# Patient Record
Sex: Female | Born: 2000
Health system: Southern US, Community
[De-identification: ages and names within clinical notes are randomized; demographics above are authoritative.]

## PROBLEM LIST (undated history)

## (undated) DIAGNOSIS — L709 Acne, unspecified: Secondary | ICD-10-CM

## (undated) DIAGNOSIS — Z789 Other specified health status: Secondary | ICD-10-CM

---

## 2018-02-06 ENCOUNTER — Emergency Department
Admission: EM | Admit: 2018-02-06 | Discharge: 2018-02-06 | Disposition: A | Discharge status: Home / Self Care | Payer: No Typology Code available for payment source | Attending: Emergency Medicine | Admitting: Emergency Medicine

## 2018-02-06 ENCOUNTER — Emergency Department: Payer: No Typology Code available for payment source

## 2018-02-06 ENCOUNTER — Encounter: Payer: Self-pay | Admitting: Emergency Medicine

## 2018-02-06 DIAGNOSIS — Y939 Activity, unspecified: Secondary | ICD-10-CM | POA: Insufficient documentation

## 2018-02-06 DIAGNOSIS — S93601A Unspecified sprain of right foot, initial encounter: Secondary | ICD-10-CM | POA: Diagnosis not present

## 2018-02-06 DIAGNOSIS — Y999 Unspecified external cause status: Secondary | ICD-10-CM | POA: Insufficient documentation

## 2018-02-06 DIAGNOSIS — T07XXXA Unspecified multiple injuries, initial encounter: Secondary | ICD-10-CM

## 2018-02-06 DIAGNOSIS — S99921A Unspecified injury of right foot, initial encounter: Secondary | ICD-10-CM | POA: Diagnosis present

## 2018-02-06 DIAGNOSIS — S90811A Abrasion, right foot, initial encounter: Secondary | ICD-10-CM | POA: Diagnosis not present

## 2018-02-06 DIAGNOSIS — Y929 Unspecified place or not applicable: Secondary | ICD-10-CM | POA: Diagnosis not present

## 2018-02-06 DIAGNOSIS — W109XXA Fall (on) (from) unspecified stairs and steps, initial encounter: Secondary | ICD-10-CM | POA: Diagnosis not present

## 2018-02-06 MED ORDER — IBUPROFEN 600 MG PO TABS: 600.0000 mg | ORAL_TABLET | Freq: Three times a day (TID) | ORAL | 0 refills | Status: AC | PRN

## 2018-02-06 NOTE — ED Triage Notes (Signed)
Patient presents to the ED with right foot pain since 4pm yesterday.  Patient states she fell down stairs and now foot is painful and swollen.  Patient denies hitting her head when she fell.  Patient is in no obvious distress.  Ambulatory to desk.

## 2018-02-06 NOTE — Discharge Instructions (Addendum)
Follow-up with Dr. Odis Luster if any continued problems in 1 to 2 weeks.  Ice and elevate to reduce swelling.  Use crutches when walking until you are able to bear weight without pain.  Watch abrasions for any signs of infection.  Take ibuprofen 600 mg 3 times daily with food.

## 2018-02-06 NOTE — ED Notes (Signed)
See triage note.states she fell down steps yesterday afternoon    Abrasions noted to top of foot   With some bruising and swelling  Ambulates with slight limp d/t pain

## 2018-02-06 NOTE — ED Provider Notes (Signed)
[facility] Department Provider Note  ____________________________________________   First MD Initiated Contact with Patient 02/06/18 1240     (approximate)  I have reviewed the triage vital signs and the nursing notes.   HISTORY  Chief Complaint Foot Pain   HPI Vanessa Weiss is a 17 y.o. female is here with complaint of right foot pain since yesterday.  Patient states that she fell down steps and is continued to have pain and swelling in her foot.  She denies hitting her head or any loss of consciousness.  Pain is increased with weightbearing.  Rates her pain as 6 out of 10.  History reviewed. No pertinent past medical history.  There are no active problems to display for this patient.   History reviewed. No pertinent surgical history.  Prior to Admission medications   Medication Sig Start Date End Date Taking? Authorizing Provider  ibuprofen (ADVIL,MOTRIN) 600 MG tablet Take 1 tablet (600 mg total) by mouth every 8 (eight) hours as needed. 02/06/18   Tommi Rumps, PA-C    Allergies Patient has no known allergies.  No family history on file.  Social History Social History   Tobacco Use  . Smoking status: Not on file  Substance Use Topics  . Alcohol use: Not on file  . Drug use: Not on file    Review of Systems Constitutional: No fever/chills Eyes: No visual changes. ENT: No trauma. Cardiovascular: Denies chest pain. Respiratory: Denies shortness of breath. Musculoskeletal: Positive for right foot pain. Skin: Positive for abrasions. Neurological: Negative for headaches, focal weakness or numbness. ____________________________________________   PHYSICAL EXAM:  VITAL SIGNS: ED Triage Vitals  Enc Vitals Group     BP 02/06/18 1224 (!) 128/108     Pulse Rate 02/06/18 1224 78     Resp 02/06/18 1224 16     Temp 02/06/18 1224 99.1 F (37.3 C)     Temp Source 02/06/18 1224 Oral     SpO2 02/06/18 1224 100 %     Weight  02/06/18 1221 120 lb (54.4 kg)     Height 02/06/18 1221  (1.727 m)     Head Circumference --      Peak Flow --      Pain Score 02/06/18 1221 6     Pain Loc --      Pain Edu? --      Excl. in GC? --     Constitutional: Alert and oriented. Well appearing and in no acute distress. Eyes: Conjunctivae are normal. PERRL. EOMI. Head: Atraumatic. Nose: No congestion/rhinnorhea. Mouth/Throat: Mucous membranes are moist.  Oropharynx non-erythematous. Neck: No stridor.   Cardiovascular: Normal rate, regular rhythm. Grossly normal heart sounds.  Good peripheral circulation. Respiratory: Normal respiratory effort.  No retractions. Lungs CTAB. Musculoskeletal: On examination of the right foot there is moderate amount of soft tissue swelling along with ecchymosis.  There is also multiple superficial abrasions noted to the dorsal aspect of the right foot.  Digits move well and motor sensory function intact.  No foreign body was noted in the abrasions.  Patient having difficulty standing without increasing pain. Neurologic:  Normal speech and language. No gross focal neurologic deficits are appreciated.  Skin:  Skin is warm, dry.  As above. Psychiatric: Mood and affect are normal. Speech and behavior are normal.  ____________________________________________   LABS (all labs ordered are listed, but only abnormal results are displayed)  Labs Reviewed - No data to display  RADIOLOGY  ED MD interpretation:  Right foot x-ray is negative for fracture.  Official radiology report(s): Dg Foot Complete Right  Result Date: 02/06/2018 CLINICAL DATA:  Right foot pain since a fall down stairs yesterday. Initial encounter. EXAM: RIGHT FOOT COMPLETE - 3+ VIEW COMPARISON:  None. FINDINGS: No acute bony or joint abnormality is identified. Diffuse soft tissue swelling is present. IMPRESSION: Soft tissue swelling without underlying bony or joint abnormality. Electronically Signed   By: Drusilla Kanner M.D.    On: 02/06/2018 12:51    ____________________________________________   PROCEDURES  Procedure(s) performed: Jones wrap and crutches were placed on the right foot per ED tech.  Procedures  Critical Care performed: No  ____________________________________________   INITIAL IMPRESSION / ASSESSMENT AND PLAN / ED COURSE  As part of my medical decision making, I reviewed the following data within the electronic MEDICAL RECORD NUMBER Notes from prior ED visits and  Controlled Substance Database  Patient is here with injury to her right foot after falling.  She was made aware that there are no fractures however she does have moderate amount of soft tissue swelling and multiple abrasions.  Patient's tetanus is up-to-date.  She was given instructions on care including ice and elevation.  Patient was placed in a Jones wrap and given crutches.  She was also given a prescription for ibuprofen 600 mg every 8 hours with food for inflammation and pain.  ____________________________________________   FINAL CLINICAL IMPRESSION(S) / ED DIAGNOSES  Final diagnoses:  Sprain of right foot, initial encounter  Multiple abrasions     ED Discharge Orders        Ordered    ibuprofen (ADVIL,MOTRIN) 600 MG tablet  Every 8 hours PRN     02/06/18 1313       Note:  This document was prepared using Dragon voice recognition software and may include unintentional dictation errors.    Tommi Rumps, PA-C 02/06/18 1451    Minna Antis, MD 02/06/18 1539

## 2018-08-09 IMAGING — DX DG FOOT COMPLETE 3+V*R*
3 series · 3 of 3 positions shown · non-contrast
Comparison: None.

CLINICAL DATA: Right foot pain since a fall down stairs yesterday.
Initial encounter.

EXAM:
RIGHT FOOT COMPLETE - 3+ VIEW

[foot ap]
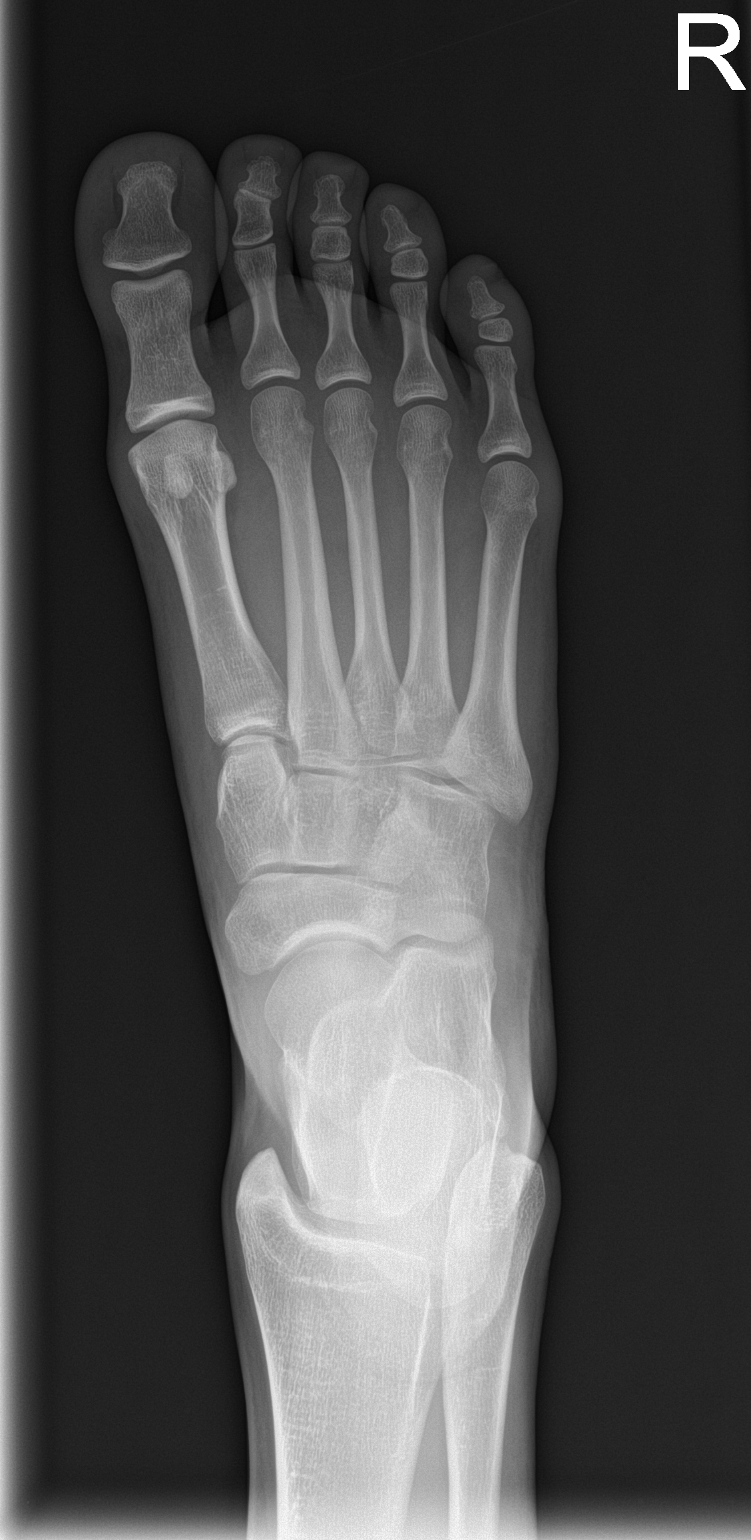

[foot obl]
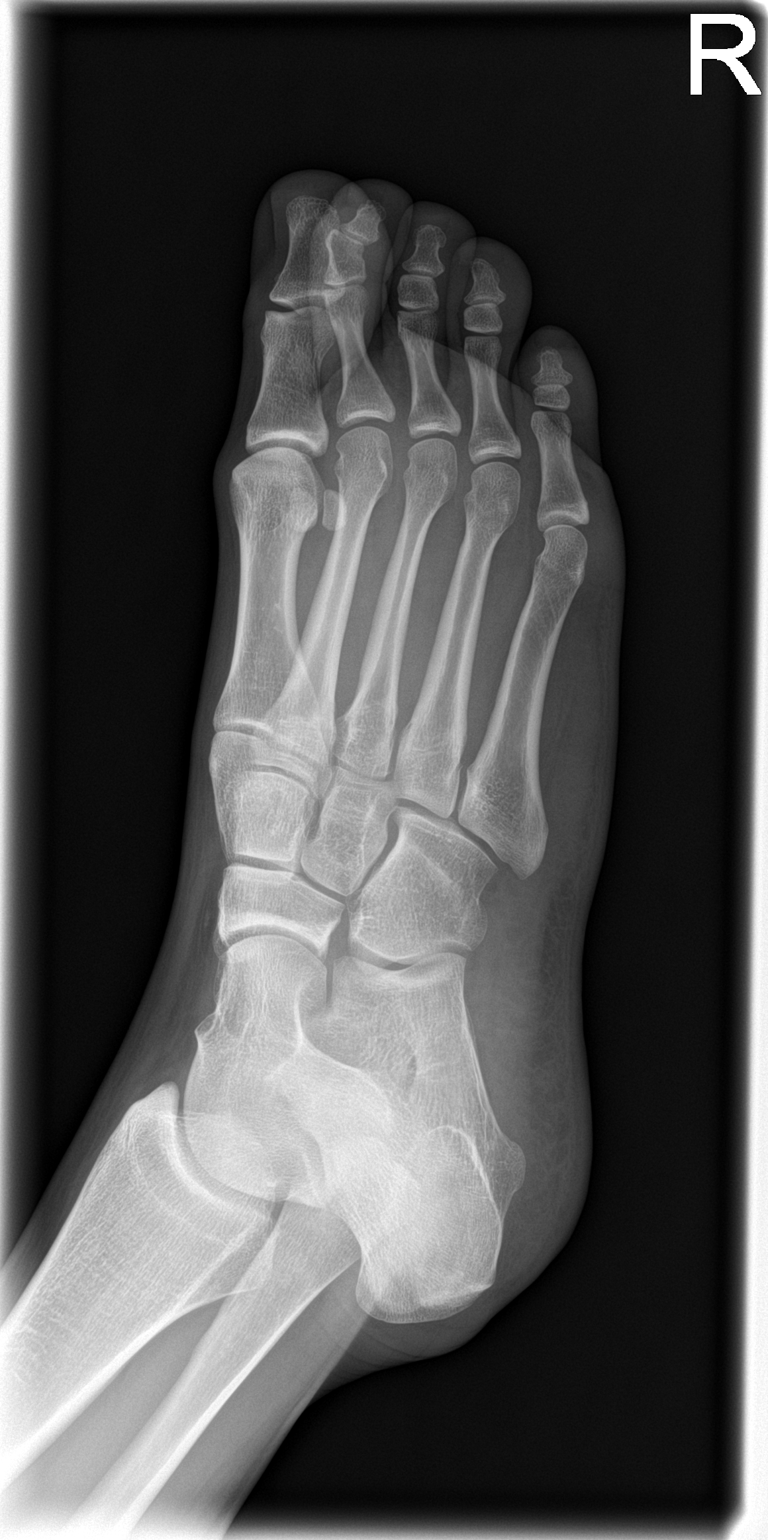

[foot lat]
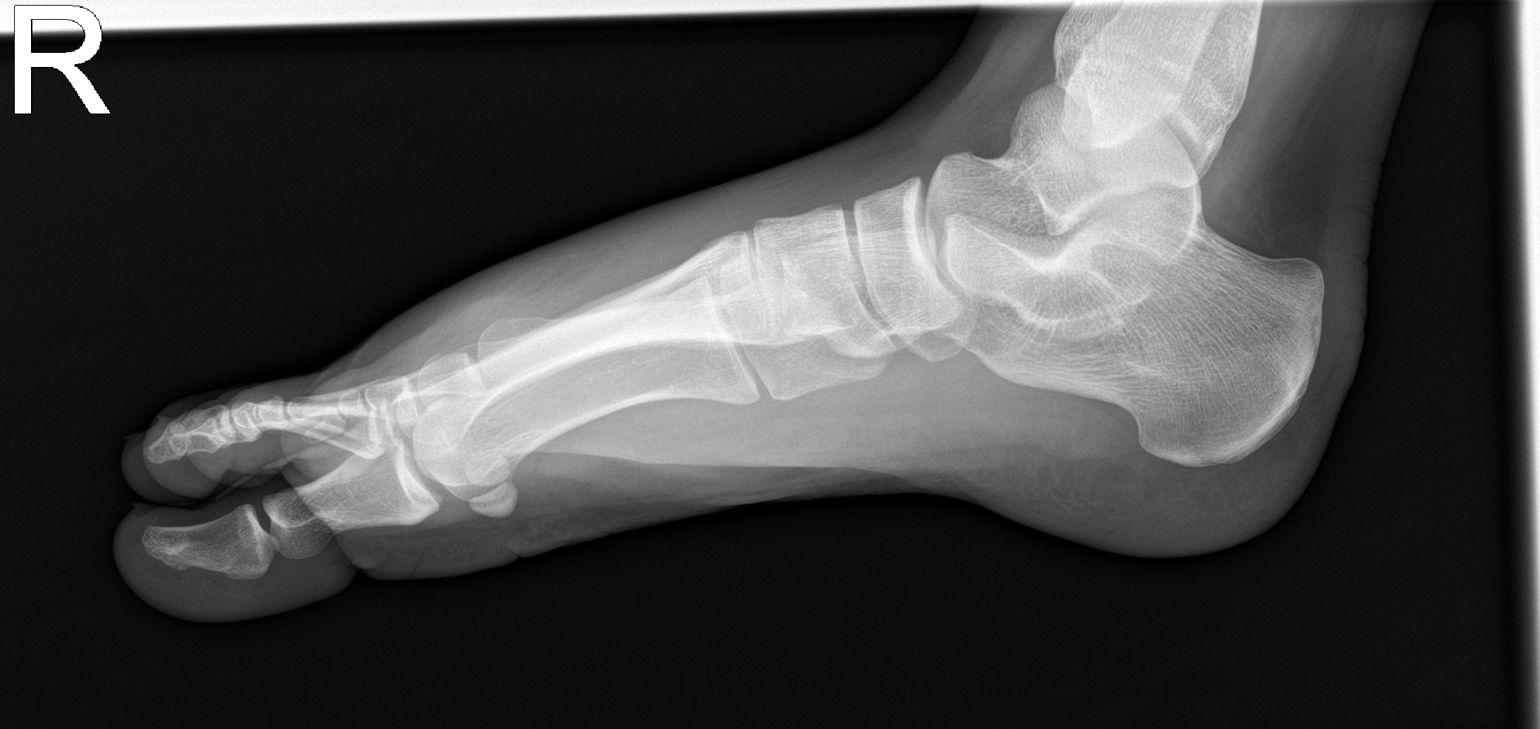

[3 of 3 positions shown; findings below may reference images not displayed]

FINDINGS: No acute bony or joint abnormality is identified. Diffuse soft
tissue swelling is present.
IMPRESSION: Soft tissue swelling without underlying bony or joint abnormality.

## 2020-08-02 ENCOUNTER — Ambulatory Visit: Admit: 2020-08-02 | Disposition: A | Discharge status: Home / Self Care | Payer: Self-pay

## 2020-08-02 ENCOUNTER — Encounter: Payer: Self-pay | Admitting: Emergency Medicine

## 2020-08-02 ENCOUNTER — Ambulatory Visit
Admission: EM | Admit: 2020-08-02 | Discharge: 2020-08-02 | Disposition: A | Discharge status: Home / Self Care | Payer: No Typology Code available for payment source | Attending: Emergency Medicine | Admitting: Emergency Medicine

## 2020-08-02 ENCOUNTER — Other Ambulatory Visit: Payer: Self-pay

## 2020-08-02 DIAGNOSIS — J029 Acute pharyngitis, unspecified: Secondary | ICD-10-CM | POA: Diagnosis present

## 2020-08-02 HISTORY — DX: Acne, unspecified: L70.9

## 2020-08-02 HISTORY — DX: Other specified health status: Z78.9

## 2020-08-02 LAB — POCT RAPID STREP A (OFFICE): Rapid Strep A Screen: NEGATIVE

## 2020-08-02 NOTE — ED Provider Notes (Signed)
Renaldo Fiddler    CSN: [id number] Arrival date & time: 08/02/20  1109      History   Chief Complaint Chief Complaint  Patient presents with  . Sore Throat    HPI Vanessa Weiss is a 19 y.o. female.   Patient presents with sore throat x1 day.  She also had chills last night.  Treatment attempted at home with Advil.  She denies fever, rash, earache, cough, shortness of breath, vomiting, diarrhea, or other symptoms.  No pertinent medical history.  The history is provided by the patient.    Past Medical History:  Diagnosis Date  . Acne   . Uses birth control     There are no problems to display for this patient.   History reviewed. No pertinent surgical history.  OB History   No obstetric history on file.      Home Medications    Prior to Admission medications   Medication Sig Start Date End Date Taking? Authorizing Provider  drospirenone-ethinyl estradiol (YAZ) 3-0.02 MG tablet Take 1 tablet by mouth daily. 07/08/20   [provider]  ibuprofen (ADVIL,MOTRIN) 600 MG tablet Take 1 tablet (600 mg total) by mouth every 8 (eight) hours as needed. 02/06/18   Tommi Rumps, PA-C  spironolactone (ALDACTONE) 100 MG tablet Take 100 mg by mouth at bedtime. 07/08/20   [provider]    Family History No family history on file.  Social History Social History   Tobacco Use  . Smoking status: Never Smoker  . Smokeless tobacco: Never Used  Substance Use Topics  . Alcohol use: Yes  . Drug use: Not on file     Allergies   Patient has no known allergies.   Review of Systems Review of Systems  Constitutional: Positive for chills. Negative for fever.  HENT: Positive for sore throat. Negative for ear pain.   Eyes: Negative for pain and visual disturbance.  Respiratory: Negative for cough and shortness of breath.   Cardiovascular: Negative for chest pain and palpitations.  Gastrointestinal: Negative for abdominal pain, diarrhea and  vomiting.  Genitourinary: Negative for dysuria and hematuria.  Musculoskeletal: Negative for arthralgias and back pain.  Skin: Negative for color change and rash.  Neurological: Negative for seizures and syncope.  All other systems reviewed and are negative.    Physical Exam Triage Vital Signs ED Triage Vitals  Enc Vitals Group     BP      Pulse      Resp      Temp      Temp src      SpO2      Weight      Height      Head Circumference      Peak Flow      Pain Score      Pain Loc      Pain Edu?      Excl. in GC?    No data found.  Updated Vital Signs BP 97/64 (BP Location: Left Arm)   Pulse 89   Temp 99 F (37.2 C) (Oral)   Resp 16   Ht 5\' 7"  (1.702 m)   Wt 120 lb (54.4 kg)   LMP 07/13/2020   SpO2 98%   BMI 18.79 kg/m   Visual Acuity Right Eye Distance:   Left Eye Distance:   Bilateral Distance:    Right Eye Near:   Left Eye Near:    Bilateral Near:     Physical Exam Vitals  and nursing note reviewed.  Constitutional:      General: She is not in acute distress.    Appearance: She is well-developed. She is not ill-appearing.  HENT:     Head: Normocephalic and atraumatic.     Right Ear: Tympanic membrane normal.     Left Ear: Tympanic membrane normal.     Nose: Nose normal.     Mouth/Throat:     Mouth: Mucous membranes are moist.     Pharynx: Posterior oropharyngeal erythema present. No oropharyngeal exudate.     Tonsils: 2+ on the right. 2+ on the left.  Eyes:     Conjunctiva/sclera: Conjunctivae normal.  Cardiovascular:     Rate and Rhythm: Normal rate and regular rhythm.     Heart sounds: No murmur heard.   Pulmonary:     Effort: Pulmonary effort is normal. No respiratory distress.     Breath sounds: Normal breath sounds.  Abdominal:     Palpations: Abdomen is soft.     Tenderness: There is no abdominal tenderness.  Musculoskeletal:     Cervical back: Neck supple.  Skin:    General: Skin is warm and dry.     Findings: No rash.    Neurological:     General: No focal deficit present.     Mental Status: She is alert and oriented to person, place, and time.     Gait: Gait normal.  Psychiatric:        Mood and Affect: Mood normal.        Behavior: Behavior normal.      UC Treatments / Results  Labs (all labs ordered are listed, but only abnormal results are displayed) Labs Reviewed  CULTURE, GROUP A STREP [facility])  POCT RAPID STREP A (OFFICE)    EKG   Radiology No results found.  Procedures Procedures (including critical care time)  Medications Ordered in UC Medications - No data to display  Initial Impression / Assessment and Plan / UC Course  I have reviewed the triage vital signs and the nursing notes.  Pertinent labs & imaging results that were available during my care of the patient were reviewed by me and considered in my medical decision making (see chart for details).   Sore throat.  Rapid strep negative; culture pending.  Symptomatic treatment discussed.  Instructed patient to follow-up with her PCP if her symptoms are not improving.  Patient agrees to plan of care.   Final Clinical Impressions(s) / UC Diagnoses   Final diagnoses:  Sore throat     Discharge Instructions     Your rapid strep test is negative.  A throat culture is pending; we will call you if it is positive requiring treatment.    Follow up with your primary care provider if your symptoms are not improving.       ED Prescriptions    None     PDMP not reviewed this encounter.   Mickie Bail, NP 08/02/20 1150

## 2020-08-02 NOTE — Discharge Instructions (Addendum)
Your rapid strep test is negative.  A throat culture is pending; we will call you if it is positive requiring treatment.    Follow up with your primary care provider if your symptoms are not improving.    

## 2020-08-02 NOTE — ED Triage Notes (Signed)
Patient in [facility] today c/o sorethroat last night stated no cough or chills. But stated that she may of had a temp.  OTC: Advil

## 2020-08-04 LAB — CULTURE, GROUP A STREP (THRC)

## 2020-08-16 ENCOUNTER — Ambulatory Visit: Payer: Self-pay
